# Patient Record
Sex: Male | Born: 1975 | Race: White | Hispanic: Yes | Marital: Single | Smoking: Never smoker
Health system: Southern US, Community
[De-identification: ages and names within clinical notes are randomized; demographics above are authoritative.]

## PROBLEM LIST (undated history)

## (undated) DIAGNOSIS — E7800 Pure hypercholesterolemia, unspecified: Secondary | ICD-10-CM

---

## 2006-03-24 ENCOUNTER — Emergency Department (HOSPITAL_COMMUNITY): Admission: EM | Admit: 2006-03-24 | Discharge: 2006-03-24 | Payer: Self-pay | Admitting: Emergency Medicine

## 2006-04-04 ENCOUNTER — Emergency Department (HOSPITAL_COMMUNITY): Admission: EM | Admit: 2006-04-04 | Discharge: 2006-04-05 | Payer: Self-pay | Admitting: Emergency Medicine

## 2011-07-02 ENCOUNTER — Ambulatory Visit: Payer: Self-pay | Admitting: Family Medicine

## 2011-07-02 VITALS — BP 142/84 | HR 74 | Temp 98.0°F | Resp 16 | Ht 60.0 in | Wt 151.8 lb

## 2011-07-02 DIAGNOSIS — S0993XA Unspecified injury of face, initial encounter: Secondary | ICD-10-CM

## 2011-07-02 DIAGNOSIS — T1490XA Injury, unspecified, initial encounter: Secondary | ICD-10-CM

## 2011-07-02 MED ORDER — AMOXICILLIN-POT CLAVULANATE 875-125 MG PO TABS: 1.0000 | ORAL_TABLET | Freq: Two times a day (BID) | ORAL | Status: AC

## 2011-07-02 NOTE — Progress Notes (Signed)
VCO. Area of left cheek cleaned, numbed with 2cc 2% lidocaine. Prepped with betadine. Hooked pushed through the skin. Hook clipped and pulled through. Clean and dressed

## 2011-07-02 NOTE — Progress Notes (Deleted)
  Subjective:    Patient ID: Leroy Wiley, male    DOB: 24-Oct-1975, 36 y.o.   MRN: [id number]  HPI    Review of Systems     Objective:   Physical Exam        Assessment & Plan:

## 2011-07-02 NOTE — Patient Instructions (Signed)
Please contact the [facility] Department Immunizations clinic Located at  [address] [place], [place] [phone]   You need to get your Tetanus Booster within the week! . [facility] [place] [facility] (Wound Care) . Mantenga el rea limpia y seca por 24 horas. No quite el vendaje, si est aplicado. Marland Kitchen Despus de 24 horas, quita el vendaje y limpia la herida suavemente con cualquier tipo de [place] y agua tibia. Reaplique un vendaje nuevo despus de limpiar herida, si est dirigido. . Limpie la herida con el jabn y agua 1 a 2 veces al Holiday representative se quitan las puntadas. . No aplique ninguna ungentos ni cremas a la herida [employer] las puntadas estn en lugar, pues sta puede causar curativo retrasado. . Notifique la oficina si usted tiene el siguiente de los muestras de la infeccin: Hinchazn, enrojecimiento, calor, drenaje del pus, de la fiebre > 101.0 F . Notifique la oficina si usted tiene la sangra excesiva eso no para despus de 15-20 minutos de presin firme y Risk analyst.

## 2011-07-02 NOTE — Progress Notes (Signed)
I have examined this patient, and performed the procedure, along with the student and agree.

## 2011-07-06 ENCOUNTER — Encounter: Payer: Self-pay | Admitting: Family Medicine

## 2011-07-06 NOTE — Progress Notes (Signed)
  Subjective:    Patient ID: Leroy Wiley, male    DOB: 04/25/1975, 36 y.o.   MRN: [id number]  HPI  Fishing with son and when son casted out fishing line hooked father's (L) cheek.  ?last Tdap  PMH/ no chronic illness or recent surgery  nonsmoker  Review of Systems     Objective:   Physical Exam  Constitutional: He appears well-developed.  Neck: Neck supple.  Cardiovascular: Normal rate, regular rhythm and normal heart sounds.   Pulmonary/Chest: Effort normal and breath sounds normal.  Skin:       Large fishing hook imbedded into (L) cheek; does not penetrate buccal mucosa        Assessment & Plan:   1. Fish hook injury of cheek  amoxicillin-clavulanate (AUGMENTIN) 875-125 MG per tablet    See PA procedure note Secondary to finances patient to receive Tdap at the [facility] Anticipatory guidance

## 2014-06-28 ENCOUNTER — Encounter (HOSPITAL_COMMUNITY): Payer: Self-pay | Admitting: Emergency Medicine

## 2014-06-28 ENCOUNTER — Emergency Department (HOSPITAL_COMMUNITY)
Admission: EM | Admit: 2014-06-28 | Discharge: 2014-06-28 | Disposition: A | Discharge status: Home / Self Care | Payer: Self-pay | Attending: Emergency Medicine | Admitting: Emergency Medicine

## 2014-06-28 DIAGNOSIS — R0789 Other chest pain: Secondary | ICD-10-CM | POA: Insufficient documentation

## 2014-06-28 DIAGNOSIS — Z8639 Personal history of other endocrine, nutritional and metabolic disease: Secondary | ICD-10-CM | POA: Insufficient documentation

## 2014-06-28 DIAGNOSIS — Z79899 Other long term (current) drug therapy: Secondary | ICD-10-CM | POA: Insufficient documentation

## 2014-06-28 HISTORY — DX: Pure hypercholesterolemia, unspecified: E78.00

## 2014-06-28 LAB — BASIC METABOLIC PANEL
ANION GAP: 7 (ref 5–15)
BUN: 13 mg/dL (ref 6–23)
CALCIUM: 8.7 mg/dL (ref 8.4–10.5)
CO2: 25 mmol/L (ref 19–32)
CREATININE: 1.08 mg/dL (ref 0.50–1.35)
Chloride: 103 mmol/L (ref 96–112)
GFR calc Af Amer: >90 mL/min (ref >90)
GFR calc non Af Amer: 86 mL/min — ABNORMAL LOW (ref >90)
Glucose, Bld: 113 mg/dL — ABNORMAL HIGH (ref 70–99)
Potassium: 3.7 mmol/L (ref 3.5–5.1)
SODIUM: 135 mmol/L (ref 135–145)

## 2014-06-28 LAB — CBC WITH DIFFERENTIAL/PLATELET
BASOS PCT: 0 % (ref 0–1)
Basophils Absolute: 0 10*3/uL (ref 0.0–0.1)
EOS ABS: 0.4 10*3/uL (ref 0.0–0.7)
EOS PCT: 4 % (ref 0–5)
HEMATOCRIT: 44 % (ref 39.0–52.0)
HEMOGLOBIN: 15.2 g/dL (ref 13.0–17.0)
LYMPHS ABS: 3.9 10*3/uL (ref 0.7–4.0)
LYMPHS PCT: 47 % — AB (ref 12–46)
MCH: 29.7 pg (ref 26.0–34.0)
MCHC: 34.5 g/dL (ref 30.0–36.0)
MCV: 85.9 fL (ref 78.0–100.0)
Monocytes Absolute: 0.8 10*3/uL (ref 0.1–1.0)
Monocytes Relative: 10 % (ref 3–12)
NEUTROS PCT: 39 % — AB (ref 43–77)
Neutro Abs: 3.3 10*3/uL (ref 1.7–7.7)
Platelets: 233 10*3/uL (ref 150–400)
RBC: 5.12 MIL/uL (ref 4.22–5.81)
RDW: 12.6 % (ref 11.5–15.5)
WBC: 8.4 10*3/uL (ref 4.0–10.5)

## 2014-06-28 LAB — I-STAT TROPONIN, ED: TROPONIN I, POC: 0 ng/mL (ref 0.00–0.08)

## 2014-06-28 LAB — TROPONIN I

## 2014-06-28 NOTE — ED Provider Notes (Addendum)
CSN: [id number]     Arrival date & time 06/28/14  0231 History   First MD Initiated Contact with Patient 06/28/14 0254     Chief Complaint  Patient presents with  . Chest Pain     (Consider location/radiation/quality/duration/timing/severity/associated sxs/prior Treatment) HPI  This is a 39 year old male with a past medical history significant only for hyperlipidemia. He is here with a chest discomfort that began about 1 AM today. The discomfort was in the left upper chest and is vaguely described. It was a 2 out of 10 at its worst. It lasted about 20 minutes. It was somewhat worse with breathing. It was accompanied by paresthesias in the left arm. He is not having shortness of breath. He is not having diaphoresis. He is not having nausea or vomiting. In triage while getting his EKG the tech noticed that he was having fasciculations of his left pectoralis major muscle which have subsequently resolved.  Past Medical History  Diagnosis Date  . High cholesterol    No past surgical history on file. No family history on file. History  Substance Use Topics  . Smoking status: Never Smoker   . Smokeless tobacco: Not on file  . Alcohol Use: Not on file    Review of Systems  All other systems reviewed and are negative.   Allergies  Review of patient's allergies indicates no known allergies.  Home Medications   Prior to Admission medications   Medication Sig Start Date End Date Taking? Authorizing Provider  acetaminophen (TYLENOL) 500 MG tablet Take 500 mg by mouth every 6 (six) hours as needed (for headache/pain).   Yes Historical Provider, MD  ibuprofen (ADVIL,MOTRIN) 200 MG tablet Take 200 mg by mouth every 6 (six) hours as needed (for pain/headache.).   Yes Historical Provider, MD  Omega-3 Fatty Acids (FISH OIL) 1000 MG CAPS Take 1,000 mg by mouth daily.   Yes Historical Provider, MD   BP 136/91 mmHg  Pulse 70  Temp(Src) 98.1 F (36.7 C) (Oral)  Resp 17  Ht 5\' 5"  (1.651 m)  Wt  150 lb (68.04 kg)  BMI 24.96 kg/m2  SpO2 98%   Physical Exam  General: Well-developed, well-nourished male in no acute distress; appearance consistent with age of record HENT: normocephalic; atraumatic Eyes: pupils equal, round and reactive to light; extraocular muscles intact Neck: supple Heart: regular rate and rhythm; no murmur Lungs: clear to auscultation bilaterally Abdomen: soft; nondistended; nontender; bowel sounds present Extremities: No deformity; full range of motion; pulses normal Neurologic: Awake, alert and oriented; motor function intact in all extremities and symmetric; no facial droop Skin: Warm and dry Psychiatric: Normal mood and affect    ED Course  Procedures (including critical care time)   MDM  Nursing notes and vitals signs, including pulse oximetry, reviewed.  Summary of this visit's results, reviewed by myself:   Date: 06/28/2014 2:39 AM  Rate: 80  Rhythm: normal sinus rhythm  QRS Axis: normal  Intervals: normal  ST/T Wave abnormalities: normal  Conduction Disutrbances: none  Narrative Interpretation: unremarkable  Comparison with previous EKG: T-waves less peaked  Labs:  Results for orders placed or performed during the hospital encounter of 06/28/14 (from the past 24 hour(s))  Basic metabolic panel     Status: Abnormal   Collection Time: 06/28/14  3:18 AM  Result Value Ref Range   Sodium 135 135 - 145 mmol/L   Potassium 3.7 3.5 - 5.1 mmol/L   Chloride 103 96 - 112 mmol/L   CO2 25  19 - 32 mmol/L   Glucose, Bld 113 (H) 70 - 99 mg/dL   BUN 13 6 - 23 mg/dL   Creatinine, Ser 0.45 0.50 - 1.35 mg/dL   Calcium 8.7 8.4 - 40.9 mg/dL   GFR calc non Af Amer 86 (L) >90 mL/min   GFR calc Af Amer >90 >90 mL/min   Anion gap 7 5 - 15  CBC with Differential     Status: Abnormal   Collection Time: 06/28/14  3:18 AM  Result Value Ref Range   WBC 8.4 4.0 - 10.5 K/uL   RBC 5.12 4.22 - 5.81 MIL/uL   Hemoglobin 15.2 13.0 - 17.0 g/dL   HCT 81.1 91.4 -  78.2 %   MCV 85.9 78.0 - 100.0 fL   MCH 29.7 26.0 - 34.0 pg   MCHC 34.5 30.0 - 36.0 g/dL   RDW 95.6 21.3 - 08.6 %   Platelets 233 150 - 400 K/uL   Neutrophils Relative % 39 (L) 43 - 77 %   Neutro Abs 3.3 1.7 - 7.7 K/uL   Lymphocytes Relative 47 (H) 12 - 46 %   Lymphs Abs 3.9 0.7 - 4.0 K/uL   Monocytes Relative 10 3 - 12 %   Monocytes Absolute 0.8 0.1 - 1.0 K/uL   Eosinophils Relative 4 0 - 5 %   Eosinophils Absolute 0.4 0.0 - 0.7 K/uL   Basophils Relative 0 0 - 1 %   Basophils Absolute 0.0 0.0 - 0.1 K/uL  I-stat troponin, ED (not at [facility])     Status: None   Collection Time: 06/28/14  3:30 AM  Result Value Ref Range   Troponin i, poc 0.00 0.00 - 0.08 ng/mL   Comment 3          Troponin I     Status: None   Collection Time: 06/28/14  5:50 AM  Result Value Ref Range   Troponin I <0.03 <0.031 ng/mL   4:09 AM Asymptomatic. Troponin negative.  6:58 AM Negative troponin 2. Patient's symptoms may have been related to pectoral fasciculations as these were witnessed in triage and the locations correlate.  Paula Libra, MD 06/28/14 [id number]  Paula Libra, MD 06/28/14 [phone]

## 2014-06-28 NOTE — ED Notes (Signed)
Pt arrived to the ED with a complaint of chest pain.  Pt states the pain is located in the left chest with a numbing to the left arm.  Pt did feel dizzy for a period of time.  Pt has no cardiac history except high cholesterol.

## 2016-02-18 ENCOUNTER — Encounter (HOSPITAL_COMMUNITY): Payer: Self-pay | Admitting: Emergency Medicine

## 2016-02-18 ENCOUNTER — Emergency Department (HOSPITAL_COMMUNITY): Payer: Self-pay

## 2016-02-18 ENCOUNTER — Emergency Department (HOSPITAL_COMMUNITY)
Admission: EM | Admit: 2016-02-18 | Discharge: 2016-02-18 | Disposition: A | Discharge status: Home / Self Care | Payer: Self-pay | Attending: Emergency Medicine | Admitting: Emergency Medicine

## 2016-02-18 DIAGNOSIS — Z79899 Other long term (current) drug therapy: Secondary | ICD-10-CM | POA: Insufficient documentation

## 2016-02-18 DIAGNOSIS — R0789 Other chest pain: Secondary | ICD-10-CM | POA: Insufficient documentation

## 2016-02-18 LAB — CBC WITH DIFFERENTIAL/PLATELET
Basophils Absolute: 0 10*3/uL (ref 0.0–0.1)
Basophils Relative: 0 %
EOS PCT: 1 %
Eosinophils Absolute: 0.1 10*3/uL (ref 0.0–0.7)
HEMATOCRIT: 42.4 % (ref 39.0–52.0)
Hemoglobin: 14.9 g/dL (ref 13.0–17.0)
LYMPHS ABS: 2.3 10*3/uL (ref 0.7–4.0)
LYMPHS PCT: 30 %
MCH: 30.2 pg (ref 26.0–34.0)
MCHC: 35.1 g/dL (ref 30.0–36.0)
MCV: 86 fL (ref 78.0–100.0)
MONO ABS: 0.8 10*3/uL (ref 0.1–1.0)
MONOS PCT: 10 %
NEUTROS ABS: 4.5 10*3/uL (ref 1.7–7.7)
Neutrophils Relative %: 59 %
PLATELETS: 207 10*3/uL (ref 150–400)
RBC: 4.93 MIL/uL (ref 4.22–5.81)
RDW: 12.4 % (ref 11.5–15.5)
WBC: 7.7 10*3/uL (ref 4.0–10.5)

## 2016-02-18 LAB — COMPREHENSIVE METABOLIC PANEL
ALBUMIN: 4.6 g/dL (ref 3.5–5.0)
ALT: 23 U/L (ref 17–63)
ANION GAP: 8 (ref 5–15)
AST: 27 U/L (ref 15–41)
Alkaline Phosphatase: 80 U/L (ref 38–126)
BILIRUBIN TOTAL: 0.5 mg/dL (ref 0.3–1.2)
BUN: 11 mg/dL (ref 6–20)
CHLORIDE: 105 mmol/L (ref 101–111)
CO2: 25 mmol/L (ref 22–32)
Calcium: 9.2 mg/dL (ref 8.9–10.3)
Creatinine, Ser: 1.03 mg/dL (ref 0.61–1.24)
GFR calc Af Amer: >60 mL/min (ref >60)
GFR calc non Af Amer: >60 mL/min (ref >60)
GLUCOSE: 114 mg/dL — AB (ref 65–99)
POTASSIUM: 3.7 mmol/L (ref 3.5–5.1)
SODIUM: 138 mmol/L (ref 135–145)
Total Protein: 7.7 g/dL (ref 6.5–8.1)

## 2016-02-18 LAB — RAPID URINE DRUG SCREEN, HOSP PERFORMED
Amphetamines: NOT DETECTED
BARBITURATES: NOT DETECTED
BENZODIAZEPINES: NOT DETECTED
Cocaine: NOT DETECTED
Opiates: NOT DETECTED
Tetrahydrocannabinol: NOT DETECTED

## 2016-02-18 LAB — I-STAT TROPONIN, ED: TROPONIN I, POC: 0 ng/mL (ref 0.00–0.08)

## 2016-02-18 NOTE — ED Triage Notes (Signed)
Patient states he is having chest pain. Patient states his arms are feeling numb. He is feeling chest pain on left side of chest. Patient states the pain just comes. Patient has not had any injury.

## 2016-02-18 NOTE — ED Notes (Signed)
Patient transported to X-ray 

## 2016-02-18 NOTE — ED Provider Notes (Signed)
[facility]-EMERGENCY DEPT Provider Note   CSN: [id number] Arrival date & time: 02/18/16  0111  By signing my name below, I, Leroy Wiley, attest that this documentation has been prepared under the direction and in the presence of Leroy Boozeavid Naz Denunzio, MD . Electronically Signed: Modena JanskyAlbert Wiley, Scribe. 02/18/2016. 2:05 AM.  History   Chief Complaint Chief Complaint  Patient presents with  . Chest Pain   The history is provided by the patient. No language interpreter was used.   HPI Comments: Leroy Wiley is a 40 y.o. male with a PMHx of hyperlipidemia who presents to the Emergency Department complaining of intermittent moderate left-sided chest pain that started today. He states that he has been having this problem over the past year, but today he had a sudden episode that lasted about 15 minutes. He describes the pain as a pressure sensation and "his heart is about to explode". He states no modifying factors. He reports associated symptoms of dizziness, BLE weakness, mild nausea, and diaphoresis. He denies any known trauma, SOB, hx of smoking, hx of alcohol/drug use, or other compaints.   Past Medical History:  Diagnosis Date  . High cholesterol     There are no active problems to display for this patient.   History reviewed. No pertinent surgical history.     Home Medications    Prior to Admission medications   Medication Sig Start Date End Date Taking? Authorizing Provider  acetaminophen (TYLENOL) 500 MG tablet Take 500 mg by mouth every 6 (six) hours as needed (for headache/pain).    Historical Provider, MD  ibuprofen (ADVIL,MOTRIN) 200 MG tablet Take 200 mg by mouth every 6 (six) hours as needed (for pain/headache.).    Historical Provider, MD  Omega-3 Fatty Acids (FISH OIL) 1000 MG CAPS Take 1,000 mg by mouth daily.    Historical Provider, MD    Family History History reviewed. No pertinent family history.  Social History Social History  Substance Use Topics  . Smoking status:  Never Smoker  . Smokeless tobacco: Never Used  . Alcohol use No     Allergies   Patient has no known allergies.   Review of Systems Review of Systems  Constitutional: Positive for diaphoresis.  Respiratory: Negative for shortness of breath.   Cardiovascular: Positive for chest pain.  Gastrointestinal: Positive for nausea (Mild).  Neurological: Positive for weakness (BLE).  All other systems reviewed and are negative.    Physical Exam Updated Vital Signs BP 137/98 (BP Location: Left Arm)   Pulse 86   Temp 98.7 F (37.1 C) (Oral)   Resp 20   Ht 5\' 2"  (1.575 m)   Wt 165 lb (74.8 kg)   SpO2 96%   BMI 30.18 kg/m   Physical Exam  Constitutional: He is oriented to person, place, and time. He appears well-developed and well-nourished.  HENT:  Head: Normocephalic and atraumatic.  Eyes: EOM are normal. Pupils are equal, round, and reactive to light.  Neck: Normal range of motion. Neck supple. No JVD present.  Cardiovascular: Normal rate, regular rhythm and normal heart sounds.   No murmur heard. Pulmonary/Chest: Effort normal and breath sounds normal. He has no wheezes. He has no rales. He exhibits no tenderness.  Abdominal: Soft. Bowel sounds are normal. He exhibits no distension and no mass. There is no tenderness.  Musculoskeletal: Normal range of motion. He exhibits no edema.  Lymphadenopathy:    He has no cervical adenopathy.  Neurological: He is alert and oriented to person, place, and time. No  cranial nerve deficit. He exhibits normal muscle tone. Coordination normal.  Skin: Skin is warm and dry. No rash noted.  Psychiatric: He has a normal mood and affect. His behavior is normal. Judgment and thought content normal.  Nursing note and vitals reviewed.    ED Treatments / Results  DIAGNOSTIC STUDIES: Oxygen Saturation is 96% on RA, normal by my interpretation.    COORDINATION OF CARE: 2:10 AM- Pt advised of plan for treatment and pt agrees.  Labs (all labs  ordered are listed, but only abnormal results are displayed) Labs Reviewed  COMPREHENSIVE METABOLIC PANEL - Abnormal; Notable for the following:       Result Value   Glucose, Bld 114 (*)    All other components within normal limits  CBC WITH DIFFERENTIAL/PLATELET  RAPID URINE DRUG SCREEN, HOSP PERFORMED  I-STAT TROPOININ, ED    EKG  EKG Interpretation  Date/Time:  Friday February 18 2016 01:26:04 EST Ventricular Rate:  86 PR Interval:    QRS Duration: 91 QT Interval:  343 QTC Calculation: 411 R Axis:   48 Text Interpretation:  Sinus rhythm RSR' in V1 or V2, probably normal variant ST elev, probable normal early repol pattern When compared with ECG of 06/28/2014, No significant change was found Confirmed by [facility]  MD, Savahanna Almendariz ([id number]) on 02/18/2016 1:30:28 AM       Radiology Dg Chest 2 View  Result Date: 02/18/2016 CLINICAL DATA:  Left-sided chest pain. EXAM: CHEST  2 VIEW COMPARISON:  None. FINDINGS: The cardiomediastinal contours are normal. The lungs are clear. Pulmonary vasculature is normal. No consolidation, pleural effusion, or pneumothorax. No acute osseous abnormalities are seen. IMPRESSION: No active cardiopulmonary disease. Electronically Signed   By: Rubye OaksMelanie  Ehinger M.D.   On: 02/18/2016 02:29    Procedures Procedures (including critical care time)  Medications Ordered in ED Medications - No data to display   Initial Impression / Assessment and Plan / ED Course  I have reviewed the triage vital signs and the nursing notes.  Pertinent labs & imaging results that were available during my care of the patient were reviewed by me and considered in my medical decision making (see chart for details).  Clinical Course    Chest pain with a very graphic but unusual description. Old records reviewed, and he has a prior ED visit for atypical chest pain. He is extremely anxious about his chest pain but I can find no obvious cause. Symptoms of been stable over a year, so I  doubt significant pathology. He is referred to cardiology for further outpatient workup.  Final Clinical Impressions(s) / ED Diagnoses   Final diagnoses:  Atypical chest pain    New Prescriptions New Prescriptions   No medications on file   I personally performed the services described in this documentation, which was scribed in my presence. The recorded information has been reviewed and is accurate.       Leroy Boozeavid Pearlie Nies, MD 02/18/16 [phone]

## 2017-08-20 ENCOUNTER — Other Ambulatory Visit: Payer: Self-pay | Admitting: Nurse Practitioner

## 2017-08-20 ENCOUNTER — Ambulatory Visit
Admission: RE | Admit: 2017-08-20 | Discharge: 2017-08-20 | Disposition: A | Discharge status: Home / Self Care | Payer: Self-pay | Source: Ambulatory Visit | Attending: Nurse Practitioner | Admitting: Nurse Practitioner

## 2017-08-20 DIAGNOSIS — R52 Pain, unspecified: Secondary | ICD-10-CM

## 2018-04-25 ENCOUNTER — Other Ambulatory Visit: Payer: Self-pay

## 2018-04-25 ENCOUNTER — Emergency Department (HOSPITAL_COMMUNITY): Payer: Self-pay

## 2018-04-25 DIAGNOSIS — R1312 Dysphagia, oropharyngeal phase: Secondary | ICD-10-CM | POA: Insufficient documentation

## 2018-04-25 DIAGNOSIS — R079 Chest pain, unspecified: Secondary | ICD-10-CM | POA: Insufficient documentation

## 2018-04-25 MED ORDER — ALBUTEROL SULFATE (2.5 MG/3ML) 0.083% IN NEBU: 5.0000 mg | INHALATION_SOLUTION | Freq: Once | RESPIRATORY_TRACT | Status: DC

## 2018-04-25 NOTE — ED Triage Notes (Signed)
Pt states for long time he has had this type of chest pain, with SHOB, vomited one time today, shaky, feels cold, dizziness.Has not been seen by his PMD for chronic symptoms.

## 2018-04-26 ENCOUNTER — Emergency Department (HOSPITAL_COMMUNITY): Payer: Self-pay

## 2018-04-26 ENCOUNTER — Encounter (HOSPITAL_COMMUNITY): Payer: Self-pay

## 2018-04-26 ENCOUNTER — Emergency Department (HOSPITAL_COMMUNITY)
Admission: EM | Admit: 2018-04-26 | Discharge: 2018-04-26 | Disposition: A | Discharge status: Home / Self Care | Payer: Self-pay | Attending: Emergency Medicine | Admitting: Emergency Medicine

## 2018-04-26 DIAGNOSIS — R1310 Dysphagia, unspecified: Secondary | ICD-10-CM

## 2018-04-26 DIAGNOSIS — R079 Chest pain, unspecified: Secondary | ICD-10-CM

## 2018-04-26 LAB — CBC WITH DIFFERENTIAL/PLATELET
Abs Immature Granulocytes: 0.03 10*3/uL (ref 0.00–0.07)
BASOS ABS: 0 10*3/uL (ref 0.0–0.1)
Basophils Relative: 0 %
EOS ABS: 0 10*3/uL (ref 0.0–0.5)
EOS PCT: 0 %
HCT: 45.1 % (ref 39.0–52.0)
Hemoglobin: 15.4 g/dL (ref 13.0–17.0)
Immature Granulocytes: 0 %
Lymphocytes Relative: 28 %
Lymphs Abs: 2.8 10*3/uL (ref 0.7–4.0)
MCH: 30 pg (ref 26.0–34.0)
MCHC: 34.1 g/dL (ref 30.0–36.0)
MCV: 87.7 fL (ref 80.0–100.0)
Monocytes Absolute: 0.6 10*3/uL (ref 0.1–1.0)
Monocytes Relative: 6 %
NRBC: 0 % (ref 0.0–0.2)
Neutro Abs: 6.4 10*3/uL (ref 1.7–7.7)
Neutrophils Relative %: 66 %
Platelets: 215 10*3/uL (ref 150–400)
RBC: 5.14 MIL/uL (ref 4.22–5.81)
RDW: 12.3 % (ref 11.5–15.5)
WBC: 10 10*3/uL (ref 4.0–10.5)

## 2018-04-26 LAB — COMPREHENSIVE METABOLIC PANEL
ALBUMIN: 4.5 g/dL (ref 3.5–5.0)
ALT: 44 U/L (ref 0–44)
ANION GAP: 9 (ref 5–15)
AST: 34 U/L (ref 15–41)
Alkaline Phosphatase: 81 U/L (ref 38–126)
BILIRUBIN TOTAL: 0.8 mg/dL (ref 0.3–1.2)
BUN: 10 mg/dL (ref 6–20)
CO2: 26 mmol/L (ref 22–32)
Calcium: 8.8 mg/dL — ABNORMAL LOW (ref 8.9–10.3)
Chloride: 99 mmol/L (ref 98–111)
Creatinine, Ser: 1 mg/dL (ref 0.61–1.24)
GFR calc Af Amer: >60 mL/min (ref >60)
GFR calc non Af Amer: >60 mL/min (ref >60)
GLUCOSE: 108 mg/dL — AB (ref 70–99)
POTASSIUM: 3.7 mmol/L (ref 3.5–5.1)
SODIUM: 134 mmol/L — AB (ref 135–145)
TOTAL PROTEIN: 7.7 g/dL (ref 6.5–8.1)

## 2018-04-26 LAB — LIPASE, BLOOD: Lipase: 54 U/L — ABNORMAL HIGH (ref 11–51)

## 2018-04-26 LAB — TROPONIN I: Troponin I: <0.03 ng/mL (ref <0.03)

## 2018-04-26 MED ORDER — PANTOPRAZOLE SODIUM 40 MG PO TBEC: 40.0000 mg | DELAYED_RELEASE_TABLET | Freq: Every day | ORAL | 0 refills | Status: AC

## 2018-04-26 MED ORDER — IOHEXOL 300 MG/ML  SOLN
75.0000 mL | Freq: Once | INTRAMUSCULAR | Status: AC | PRN
  Administered 2018-04-26: 75 mL via INTRAVENOUS

## 2018-04-26 MED ORDER — SODIUM CHLORIDE 0.9 % IV BOLUS
1000.0000 mL | Freq: Once | INTRAVENOUS | Status: AC
  Administered 2018-04-26: 1000 mL via INTRAVENOUS

## 2018-04-26 MED ORDER — SODIUM CHLORIDE (PF) 0.9 % IJ SOLN
INTRAMUSCULAR | Status: AC
  Filled 2018-04-26: qty 50

## 2018-04-26 NOTE — ED Provider Notes (Signed)
[facility]-EMERGENCY DEPT Provider Note   CSN: [id number] Arrival date & time: 04/25/18  2024     History   Chief Complaint Chief Complaint  Patient presents with  . Chest Pain  . Shortness of Breath  . Emesis    HPI Leroy Wiley is a 43 y.o. male.  Patient presents to the emergency department for evaluation of multiple problems.  He reports that he has been having problems for approximately 6 months.  He has been experiencing intermittent dizziness and shakiness.  He also has noticed difficulty with swallowing.  Reports that his mucus and phlegm gets caught in the back of his throat.  He needs to try to initiate swallowing several times before he can clear the throat.  He has not had any cough or shortness of breath.  He denies history of reflux.     Past Medical History:  Diagnosis Date  . High cholesterol     There are no active problems to display for this patient.   No past surgical history on file.      Home Medications    Prior to Admission medications   Medication Sig Start Date End Date Taking? Authorizing Provider  acetaminophen (TYLENOL) 500 MG tablet Take 500 mg by mouth every 6 (six) hours as needed (for headache/pain).   Yes [provider]  aspirin 81 MG chewable tablet Chew 81 mg by mouth daily as needed for mild pain.   Yes [provider]  ibuprofen (ADVIL,MOTRIN) 200 MG tablet Take 200 mg by mouth every 6 (six) hours as needed (for pain/headache.).   Yes [provider]  meclizine (ANTIVERT) 12.5 MG tablet Take 12.5 mg by mouth 3 (three) times daily as needed for dizziness.   Yes [provider]  pantoprazole (PROTONIX) 40 MG tablet Take 1 tablet (40 mg total) by mouth daily. 04/26/18   Gilda Crease, MD    Family History No family history on file.  Social History Social History   Tobacco Use  . Smoking status: Never Smoker  . Smokeless tobacco: Never Used  Substance Use  Topics  . Alcohol use: No  . Drug use: No     Allergies   Patient has no known allergies.   Review of Systems Review of Systems  Respiratory: Negative for shortness of breath.   Cardiovascular: Positive for chest pain.  Gastrointestinal: Negative for abdominal pain and vomiting.  Neurological: Positive for dizziness.  All other systems reviewed and are negative.    Physical Exam Updated Vital Signs BP (!) 141/97   Pulse 82   Temp 98.4 F (36.9 C) (Oral)   Resp 17   Ht 5\' 4"  (1.626 m)   Wt 74.8 kg   SpO2 98%   BMI 28.32 kg/m   Physical Exam Vitals signs and nursing note reviewed.  Constitutional:      General: He is not in acute distress.    Appearance: Normal appearance. He is well-developed.  HENT:     Head: Normocephalic and atraumatic.     Right Ear: Hearing normal.     Left Ear: Hearing normal.     Nose: Nose normal.     Mouth/Throat:     Mouth: Mucous membranes are moist.     Tongue: No lesions.     Palate: No mass and lesions.     Pharynx: Oropharynx is clear.  Eyes:     Conjunctiva/sclera: Conjunctivae normal.     Pupils: Pupils are equal, round, and reactive  to light.  Neck:     Musculoskeletal: Normal range of motion and neck supple.  Cardiovascular:     Rate and Rhythm: Regular rhythm.     Heart sounds: S1 normal and S2 normal. No murmur. No friction rub. No gallop.   Pulmonary:     Effort: Pulmonary effort is normal. No respiratory distress.     Breath sounds: Normal breath sounds.  Chest:     Chest wall: No tenderness.  Abdominal:     General: Bowel sounds are normal.     Palpations: Abdomen is soft.     Tenderness: There is no abdominal tenderness. There is no guarding or rebound. Negative signs include Murphy's sign and McBurney's sign.     Hernia: No hernia is present.  Musculoskeletal: Normal range of motion.  Skin:    General: Skin is warm and dry.     Findings: No rash.  Neurological:     Mental Status: He is alert and oriented  to person, place, and time.     GCS: GCS eye subscore is 4. GCS verbal subscore is 5. GCS motor subscore is 6.     Cranial Nerves: No cranial nerve deficit.     Sensory: No sensory deficit.     Coordination: Coordination normal.  Psychiatric:        Speech: Speech normal.        Behavior: Behavior normal.        Thought Content: Thought content normal.      ED Treatments / Results  Labs (all labs ordered are listed, but only abnormal results are displayed) Labs Reviewed  COMPREHENSIVE METABOLIC PANEL - Abnormal; Notable for the following components:      Result Value   Sodium 134 (*)    Glucose, Bld 108 (*)    Calcium 8.8 (*)    All other components within normal limits  LIPASE, BLOOD - Abnormal; Notable for the following components:   Lipase 54 (*)    All other components within normal limits  CBC WITH DIFFERENTIAL/PLATELET  TROPONIN I    EKG EKG Interpretation  Date/Time:  Thursday April 25 2018 20:48:34 EST Ventricular Rate:  81 PR Interval:    QRS Duration: 81 QT Interval:  366 QTC Calculation: 425 R Axis:   57 Text Interpretation:  Sinus rhythm ST elev, probable normal early repol pattern No significant change since last tracing Confirmed by Gilda CreasePollina, Ilaisaane Marts J [phone]([id number]) on 04/26/2018 1:19:48 AM   Radiology Dg Chest 2 View  Result Date: 04/25/2018 CLINICAL DATA:  Shortness of breath.  Chest congestion EXAM: CHEST - 2 VIEW COMPARISON:  08/20/2017 FINDINGS: Heart and mediastinal contours are within normal limits. No focal opacities or effusions. No acute bony abnormality. IMPRESSION: No active cardiopulmonary disease. Electronically Signed   By: Charlett NoseKevin  Dover M.D.   On: 04/25/2018 21:06   Ct Soft Tissue Neck W Contrast  Result Date: 04/26/2018 CLINICAL DATA:  Initial evaluation for sensation of neck swelling/mass in throat, difficulty swallowing for past 6 months. EXAM: CT NECK WITH CONTRAST TECHNIQUE: Multidetector CT imaging of the neck was performed using the  standard protocol following the bolus administration of intravenous contrast. CONTRAST:  75mL OMNIPAQUE IOHEXOL 300 MG/ML  SOLN COMPARISON:  None available. FINDINGS: Pharynx and larynx: Oral cavity within normal limits without mass lesion or loculated fluid collection. Patient is largely edentulous. Palatine tonsils mildly prominent but symmetric in size and within normal limits for peer it has. Multiple calcified tonsilliths present bilaterally, left greater than right. No  base of tongue lesion. Parapharyngeal fat preserved. Nasopharynx normal. No retropharyngeal collection. Epiglottis within normal limits. Vallecula partially effaced by the lingual tonsils but grossly normal. Remainder of the hypopharynx and supraglottic larynx within normal limits. Glottis normal. Subglottic airway clear. Salivary glands: Salivary glands including the parotid and submandibular glands are normal. Thyroid: Normal. Lymph nodes: No adenopathy within the neck. Vascular: Normal intravascular enhancement seen throughout the neck. Limited intracranial: Unremarkable Visualized orbits: Visualized globes and orbital soft tissues within normal limits. Mastoids and visualized paranasal sinuses: Few small left maxillary sinus retention cyst partially visualized. Mild scattered mucosal thickening within the visualized ethmoidal air cells. Paranasal sinuses are otherwise clear. Visualize mastoids and middle ear cavities are well pneumatized and free of fluid. Skeleton: No acute osseous finding. No worrisome lytic or blastic osseous lesions. Upper chest: Visualized upper chest demonstrates no acute finding. Subcentimeter calcified granuloma noted at the posterior left upper lobe. Visualized lungs are otherwise clear. Other: None. IMPRESSION: Negative CT of the neck. No mass lesion, adenopathy, or acute inflammatory changes identified. No findings to explain patient's symptoms. Electronically Signed   By: Rise Mu M.D.   On:  04/26/2018 04:21    Procedures Procedures (including critical care time)  Medications Ordered in ED Medications  sodium chloride (PF) 0.9 % injection (has no administration in time range)  sodium chloride 0.9 % bolus 1,000 mL (1,000 mLs Intravenous New Bag/Given (Non-Interop) 04/26/18 0211)  iohexol (OMNIPAQUE) 300 MG/ML solution 75 mL (75 mLs Intravenous Contrast Given 04/26/18 0357)     Initial Impression / Assessment and Plan / ED Course  I have reviewed the triage vital signs and the nursing notes.  Pertinent labs & imaging results that were available during my care of the patient were reviewed by me and considered in my medical decision making (see chart for details).     Patient presents to the emergency department for evaluation of multiple problems.  Most of his problems have been ongoing for some time.  He has had some chest pain intermittently but it is not currently present.  His only cardiac risk factor is high cholesterol.  He is felt to be very low risk for cardiac etiology.  EKG, troponin unremarkable.  Patient felt to be adequately evaluated for low risk chest pain again follow-up as needed as an outpatient.  Patient complaining of difficulty swallowing.  This is been ongoing for at least 6 months.  He reports that he feels like things get caught in his throat when he swallows.  He has no difficulty initiating swallowing here in the ER.  He is handling his secretions without difficulty.  CT neck was performed, no acute abnormality noted that would require direct visualization by ENT.  Patient will therefore be discharged to gastroenterology for follow-up of dysphagia, treat with proton pump inhibitor.  Final Clinical Impressions(s) / ED Diagnoses   Final diagnoses:  Chest pain, unspecified type  Dysphagia, unspecified type    ED Discharge Orders         Ordered    pantoprazole (PROTONIX) 40 MG tablet  Daily     04/26/18 0604           Gilda Crease,  MD 04/26/18 [phone]

## 2020-09-28 IMAGING — CR DG CHEST 2V
2 series · 2 of 2 positions shown · non-contrast
Comparison: 08/20/2017

CLINICAL DATA: Shortness of breath.  Chest congestion

EXAM:
CHEST - 2 VIEW

[w chest pa]
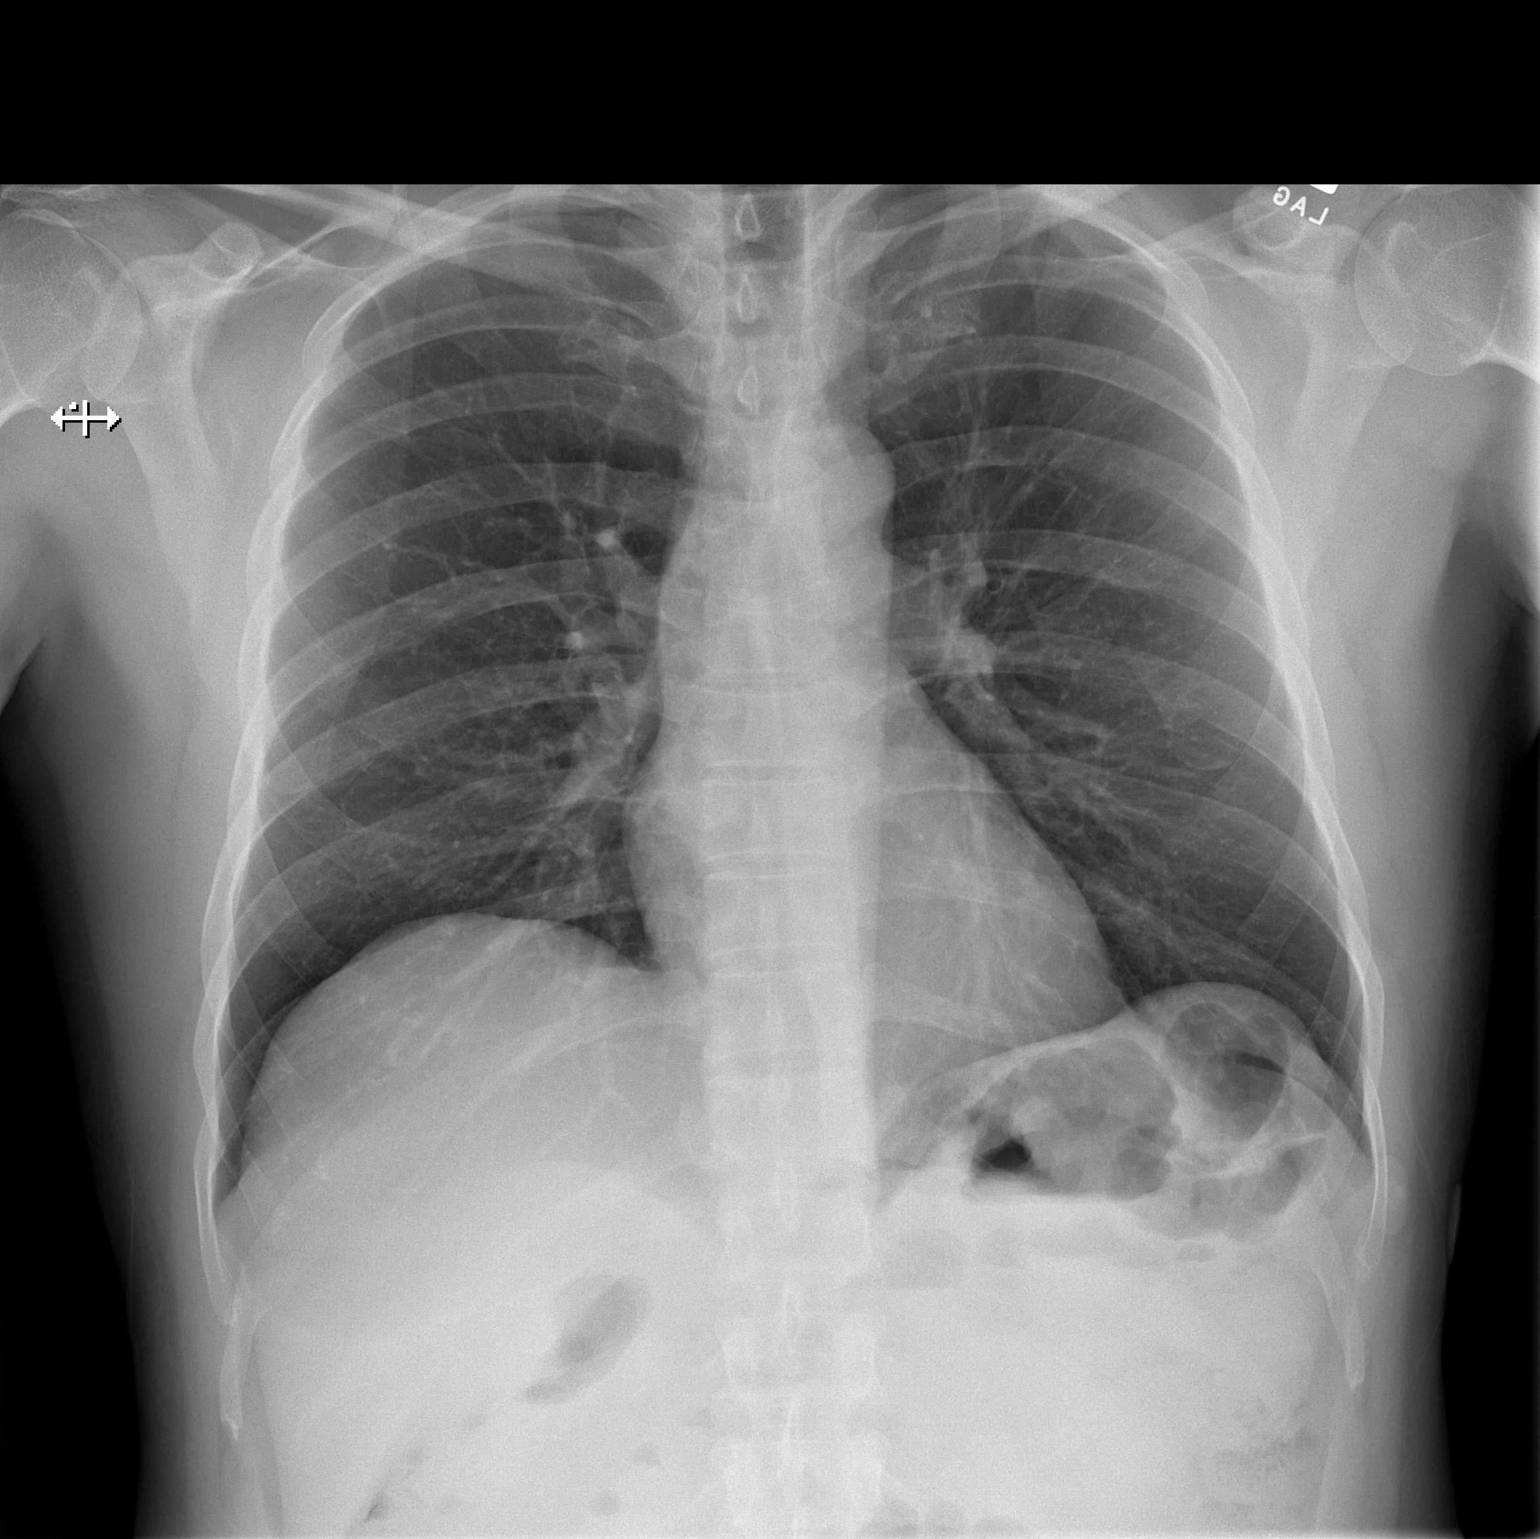

[w chest lat]
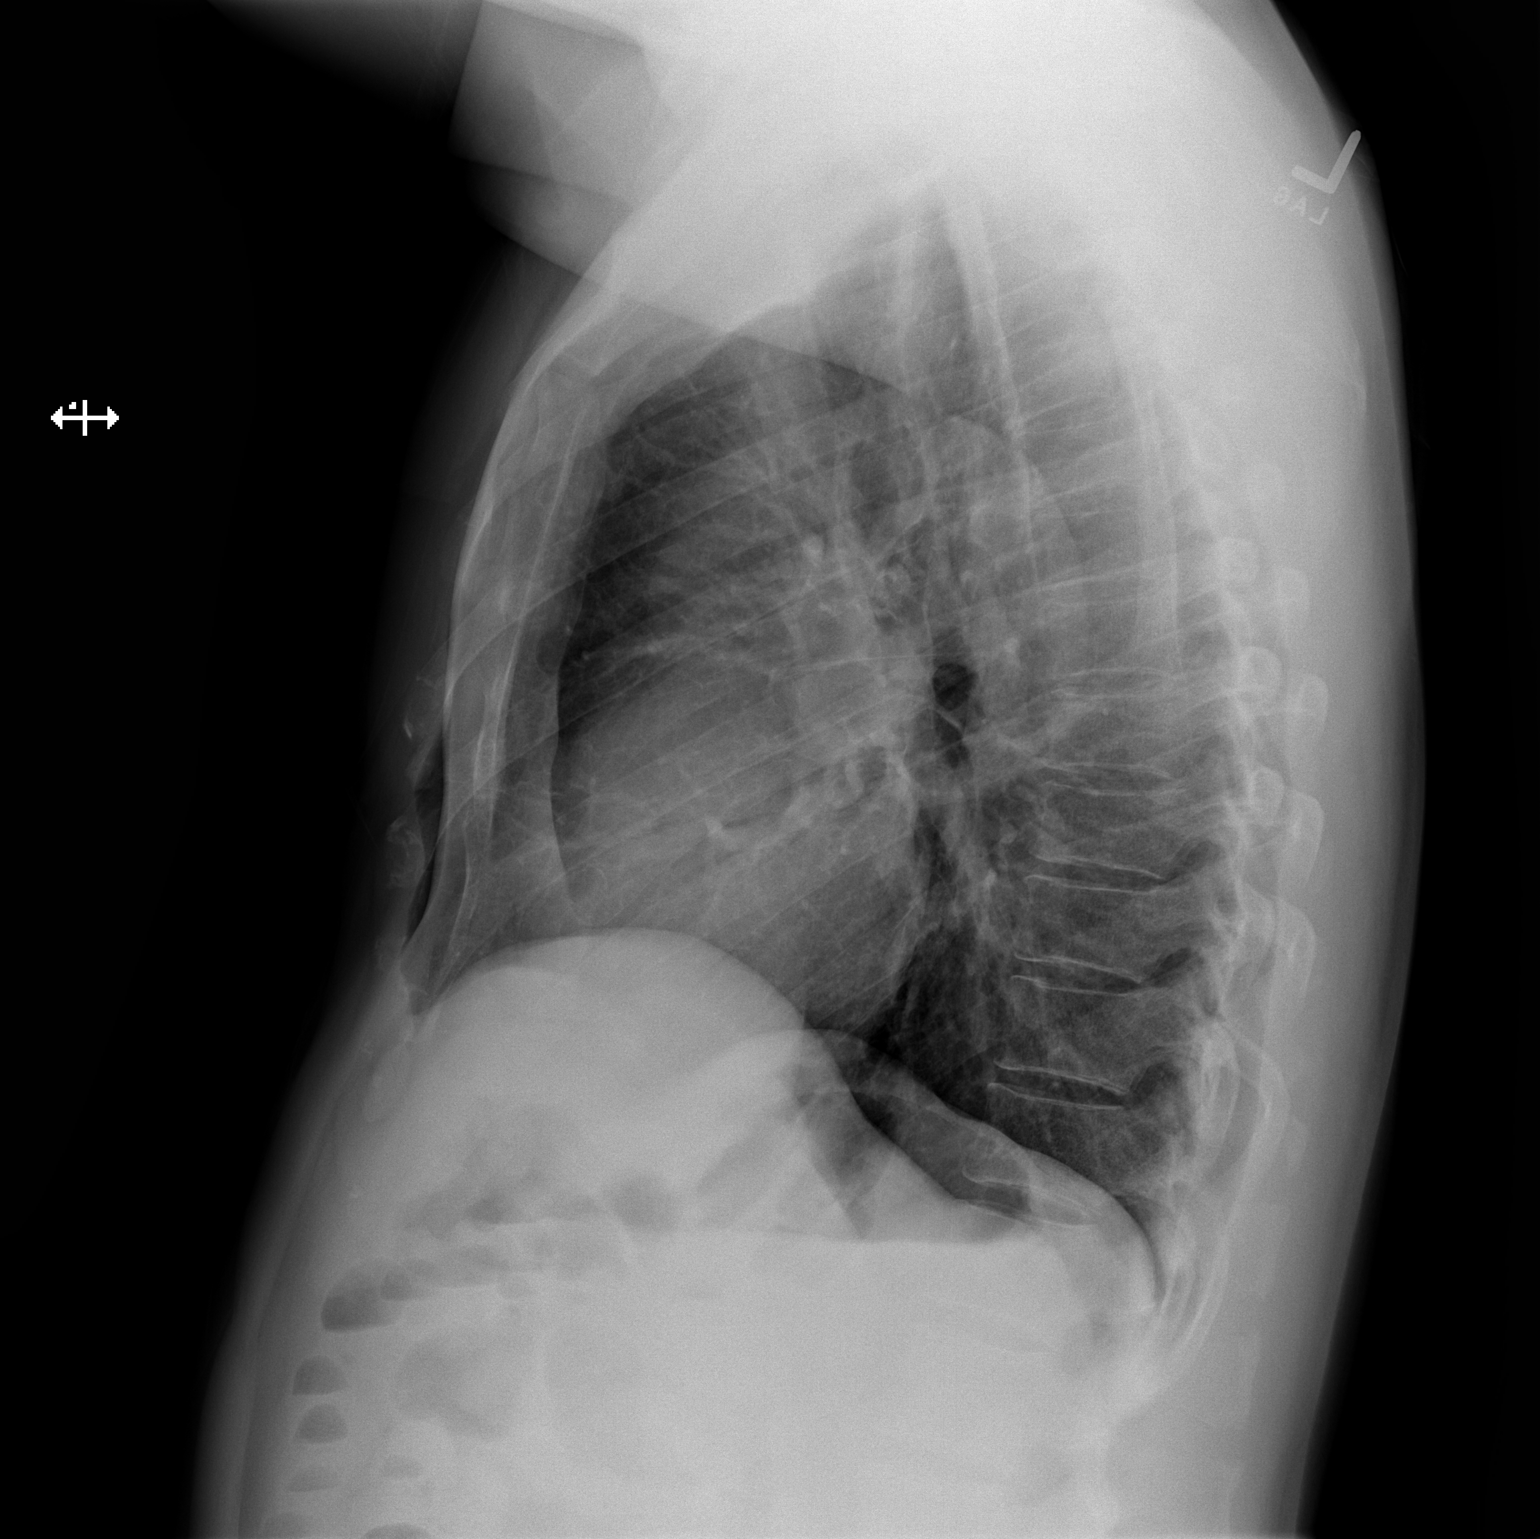

[2 of 2 positions shown; findings below may reference images not displayed]

FINDINGS: Heart and mediastinal contours are within normal limits. No focal
opacities or effusions. No acute bony abnormality.
IMPRESSION: No active cardiopulmonary disease.

## 2020-09-29 IMAGING — CT CT NECK W/ CM
4 of 5 series · 14 of 33 positions shown, 16 images · IV contrast (omnipaque)
Comparison: None available.

CLINICAL DATA: Initial evaluation for sensation of neck
swelling/mass in throat, difficulty swallowing for past 6 months.

EXAM:
CT NECK WITH CONTRAST
TECHNIQUE: Multidetector CT imaging of the neck was performed using the
standard protocol following the bolus administration of intravenous
contrast.
CONTRAST:  75mL OMNIPAQUE IOHEXOL 300 MG/ML  SOLN

[Series 3: axial neck · axial · 0.45mm/px · z∈[-277,-151]mm · 4 of 106 slices shown, 5 images]
[im 22/106  soft-tissue]
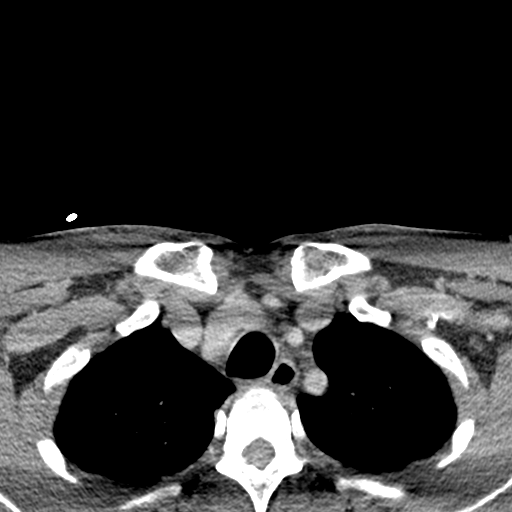
[im 22/106  bone]
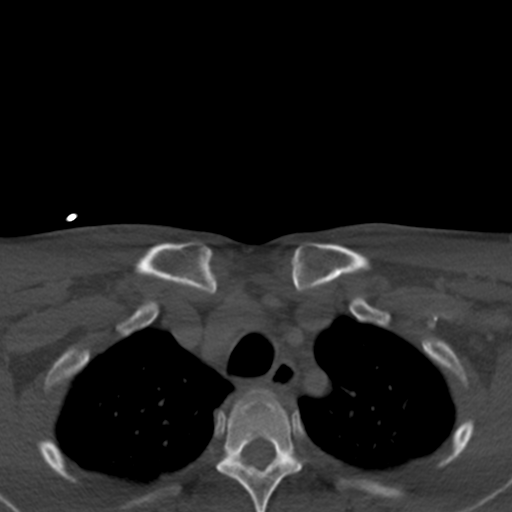
[im 43/106  bone]
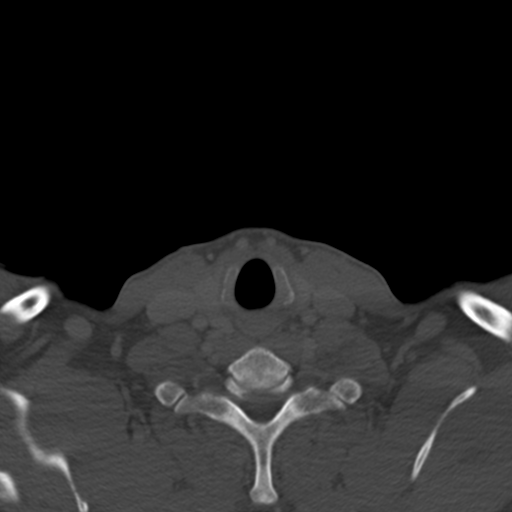
[im 64/106  bone]
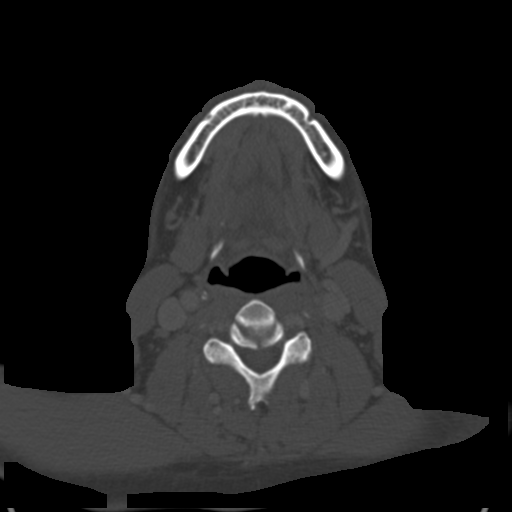
[im 85/106  bone]
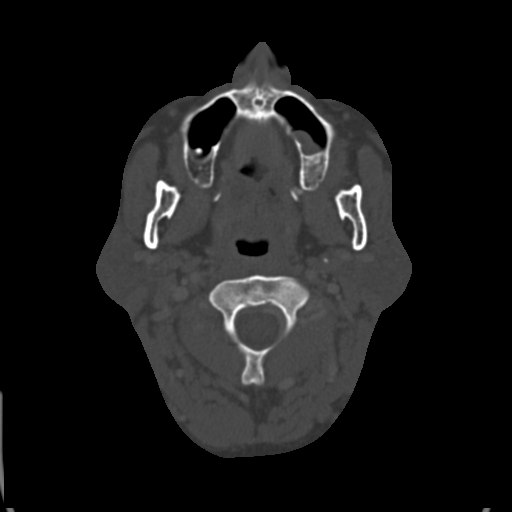

[Series 6: ax · axial · 0.39mm/px · z∈[-317,-278]mm · 2 of 116 slices shown]
[im 24/116  bone]
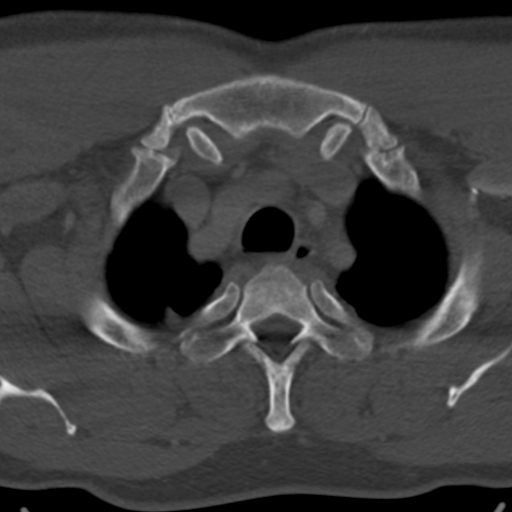
[im 47/116  bone]
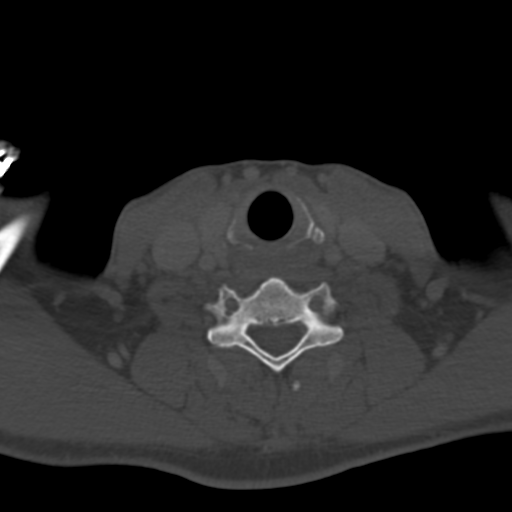

[Series 7: cor neck · coronal · 0.46mm/px · 3 of 91 slices shown]
[im 30/91  bone]
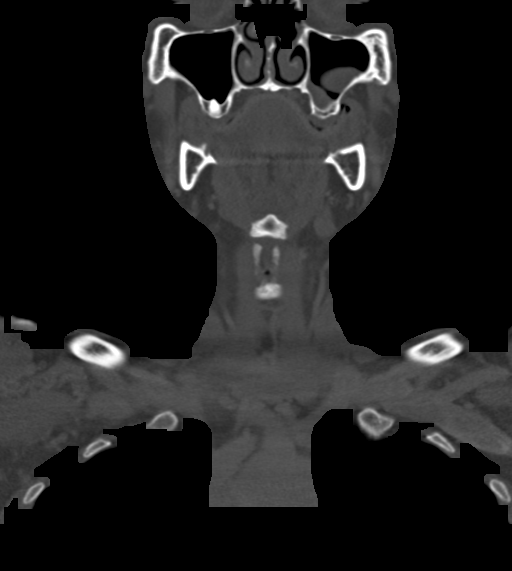
[im 40/91  bone]
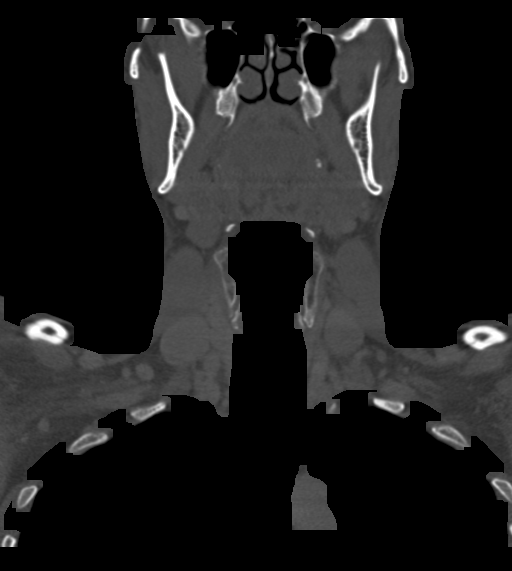
[im 51/91  bone]
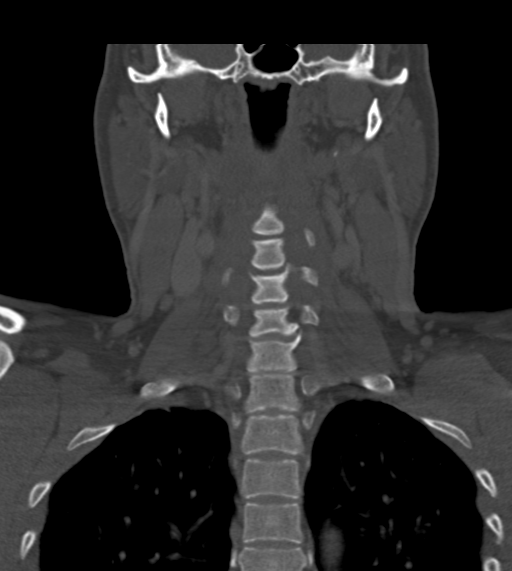

[Series 8: sag neck · sagittal · 0.37mm/px · 5 of 101 slices shown, 6 images]
[im 34/101  bone]
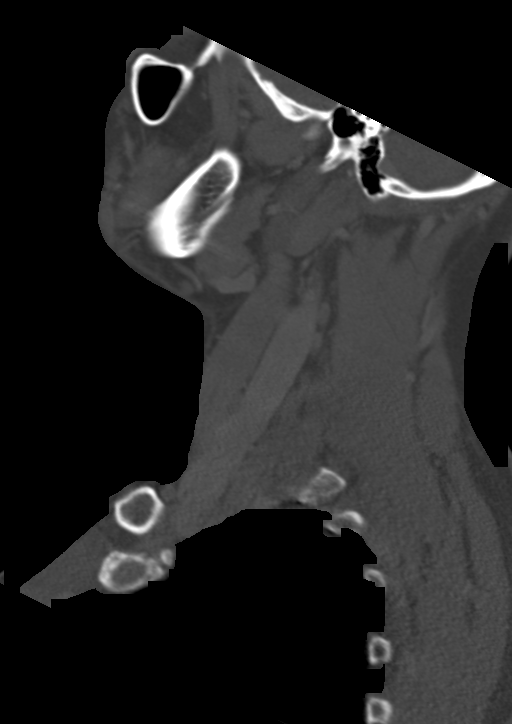
[im 42/101  bone]
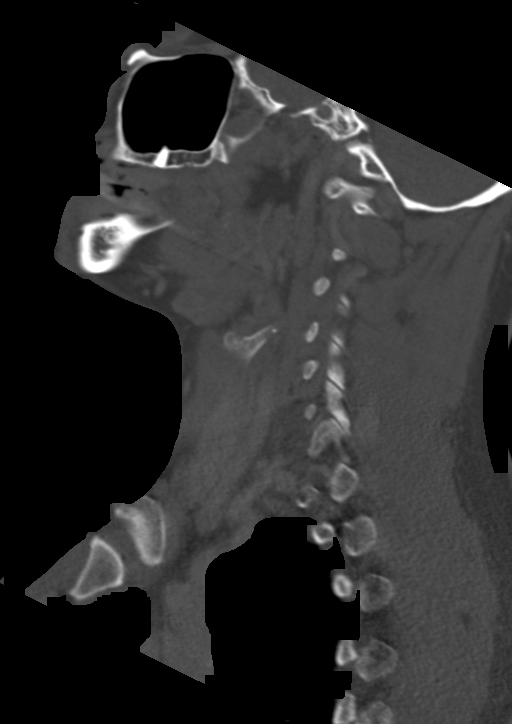
[im 51/101  soft-tissue]
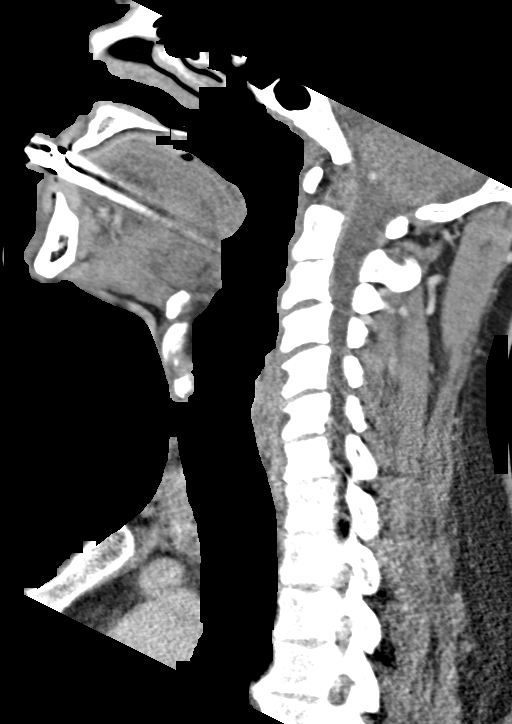
[im 51/101  bone]
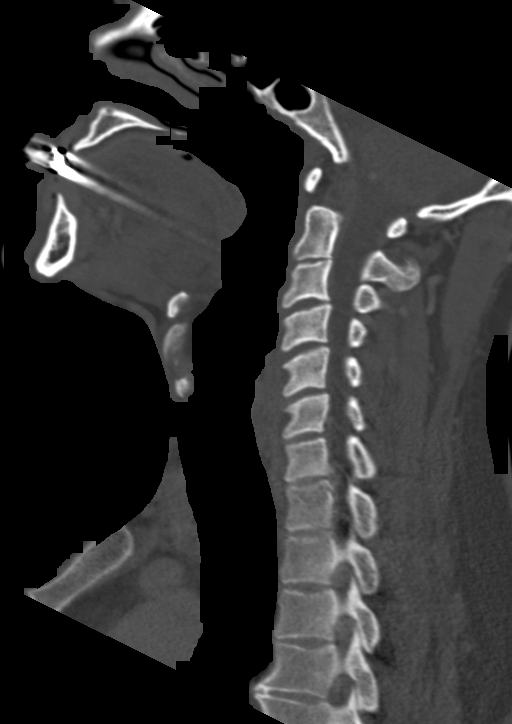
[im 59/101  bone]
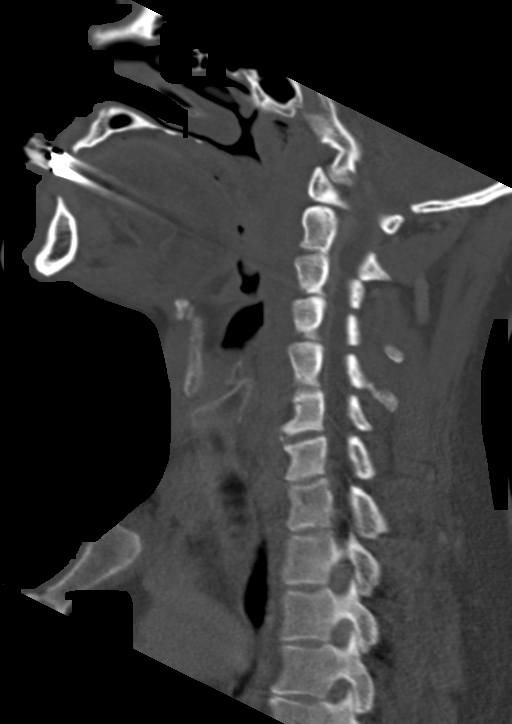
[im 67/101  bone]
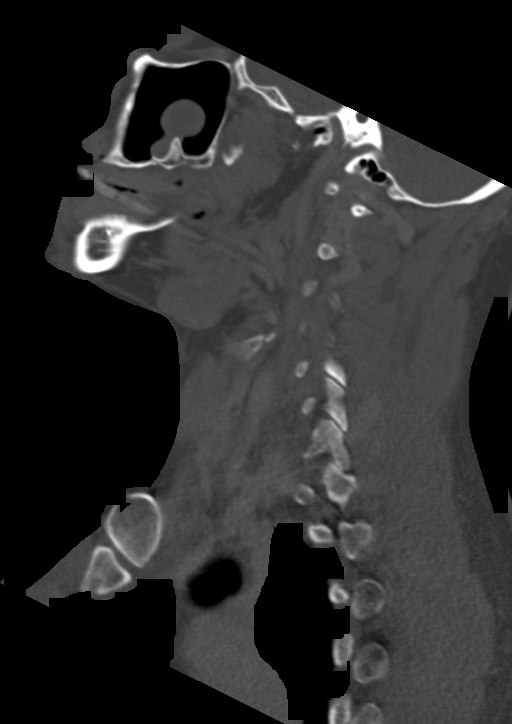

[14 of 33 positions shown; findings below may reference images not displayed]

FINDINGS: Pharynx and larynx: Oral cavity within normal limits without mass
lesion or loculated fluid collection. Patient is largely edentulous.
Palatine tonsils mildly prominent but symmetric in size and within
normal limits for peer it has. Multiple calcified tonsilliths
present bilaterally, left greater than right. No base of tongue
lesion. Parapharyngeal fat preserved. Nasopharynx normal. No
retropharyngeal collection. Epiglottis within normal limits.
Vallecula partially effaced by the lingual tonsils but grossly
normal. Remainder of the hypopharynx and supraglottic larynx within
normal limits. Glottis normal. Subglottic airway clear.

Salivary glands: Salivary glands including the parotid and
submandibular glands are normal.

Thyroid: Normal.

Lymph nodes: No adenopathy within the neck.

Vascular: Normal intravascular enhancement seen throughout the neck.

Limited intracranial: Unremarkable

Visualized orbits: Visualized globes and orbital soft tissues within
normal limits.

Mastoids and visualized paranasal sinuses: Few small left maxillary
sinus retention cyst partially visualized. Mild scattered mucosal
thickening within the visualized ethmoidal air cells. Paranasal
sinuses are otherwise clear. Visualize mastoids and middle ear
cavities are well pneumatized and free of fluid.

Skeleton: No acute osseous finding. No worrisome lytic or blastic
osseous lesions.

Upper chest: Visualized upper chest demonstrates no acute finding.
Subcentimeter calcified granuloma noted at the posterior left upper
lobe. Visualized lungs are otherwise clear.

Other: None.
IMPRESSION: Negative CT of the neck. No mass lesion, adenopathy, or acute
inflammatory changes identified. No findings to explain patient's
symptoms.
